# Patient Record
Sex: Male | Born: 1960 | Race: White | Hispanic: No | Marital: Married | Smoking: Never smoker
Health system: Southern US, Community
[De-identification: ages and names within clinical notes are randomized; demographics above are authoritative.]

## PROBLEM LIST (undated history)

## (undated) DIAGNOSIS — K219 Gastro-esophageal reflux disease without esophagitis: Secondary | ICD-10-CM

## (undated) DIAGNOSIS — M2550 Pain in unspecified joint: Secondary | ICD-10-CM

## (undated) DIAGNOSIS — M1990 Unspecified osteoarthritis, unspecified site: Secondary | ICD-10-CM

## (undated) HISTORY — DX: Unspecified osteoarthritis, unspecified site: M19.90

## (undated) HISTORY — PX: NO PAST SURGERIES: SHX2092

## (undated) HISTORY — DX: Pain in unspecified joint: M25.50

---

## 2001-01-05 ENCOUNTER — Emergency Department (HOSPITAL_COMMUNITY): Admission: EM | Admit: 2001-01-05 | Discharge: 2001-01-05 | Payer: Self-pay

## 2012-05-15 ENCOUNTER — Encounter (INDEPENDENT_AMBULATORY_CARE_PROVIDER_SITE_OTHER): Payer: Self-pay | Admitting: *Deleted

## 2013-07-18 ENCOUNTER — Encounter (INDEPENDENT_AMBULATORY_CARE_PROVIDER_SITE_OTHER): Payer: Self-pay | Admitting: *Deleted

## 2013-09-25 ENCOUNTER — Other Ambulatory Visit (INDEPENDENT_AMBULATORY_CARE_PROVIDER_SITE_OTHER): Payer: Self-pay | Admitting: *Deleted

## 2013-09-25 ENCOUNTER — Encounter (INDEPENDENT_AMBULATORY_CARE_PROVIDER_SITE_OTHER): Payer: Self-pay | Admitting: *Deleted

## 2013-09-25 DIAGNOSIS — Z1211 Encounter for screening for malignant neoplasm of colon: Secondary | ICD-10-CM

## 2013-11-11 ENCOUNTER — Telehealth (INDEPENDENT_AMBULATORY_CARE_PROVIDER_SITE_OTHER): Payer: Self-pay | Admitting: *Deleted

## 2013-11-11 DIAGNOSIS — Z1211 Encounter for screening for malignant neoplasm of colon: Secondary | ICD-10-CM

## 2013-11-11 MED ORDER — PEG-KCL-NACL-NASULF-NA ASC-C 100 G PO SOLR: 1.0000 | Freq: Once | ORAL | Status: DC

## 2013-11-11 NOTE — Telephone Encounter (Signed)
Patient needs movi prep 

## 2013-11-21 ENCOUNTER — Telehealth (INDEPENDENT_AMBULATORY_CARE_PROVIDER_SITE_OTHER): Payer: Self-pay | Admitting: *Deleted

## 2013-11-21 NOTE — Telephone Encounter (Signed)
Referring MD/PCP: golding   Procedure: tcs  Reason/Indication:  screening  Has patient had this procedure before?  no  If so, when, by whom and where?    Is there a family history of colon cancer?  no  Who?  What age when diagnosed?    Is patient diabetic?   no      Does patient have prosthetic heart valve?  no  Do you have a pacemaker?  no  Has patient ever had endocarditis? no  Has patient had joint replacement within last 12 months?  no  Does patient tend to be constipated or take laxatives? no  Is patient on Coumadin, Plavix and/or Aspirin? no  Medications: none  Allergies:   Medication Adjustment:   Procedure date & time: 12/18/13 at 830

## 2013-11-24 NOTE — Telephone Encounter (Signed)
agree

## 2013-12-18 ENCOUNTER — Encounter (HOSPITAL_COMMUNITY): Payer: Self-pay | Admitting: *Deleted

## 2013-12-18 ENCOUNTER — Encounter (HOSPITAL_COMMUNITY)
Admission: RE | Disposition: A | Discharge status: Home / Self Care | Payer: Self-pay | Source: Ambulatory Visit | Attending: Internal Medicine

## 2013-12-18 ENCOUNTER — Ambulatory Visit (HOSPITAL_COMMUNITY)
Admission: RE | Admit: 2013-12-18 | Discharge: 2013-12-18 | Disposition: A | Discharge status: Home / Self Care | Payer: Managed Care, Other (non HMO) | Source: Ambulatory Visit | Attending: Internal Medicine | Admitting: Internal Medicine

## 2013-12-18 DIAGNOSIS — Z1211 Encounter for screening for malignant neoplasm of colon: Secondary | ICD-10-CM | POA: Diagnosis present

## 2013-12-18 DIAGNOSIS — K649 Unspecified hemorrhoids: Secondary | ICD-10-CM

## 2013-12-18 DIAGNOSIS — K644 Residual hemorrhoidal skin tags: Secondary | ICD-10-CM | POA: Diagnosis not present

## 2013-12-18 DIAGNOSIS — K219 Gastro-esophageal reflux disease without esophagitis: Secondary | ICD-10-CM | POA: Diagnosis not present

## 2013-12-18 DIAGNOSIS — K5730 Diverticulosis of large intestine without perforation or abscess without bleeding: Secondary | ICD-10-CM | POA: Insufficient documentation

## 2013-12-18 HISTORY — DX: Gastro-esophageal reflux disease without esophagitis: K21.9

## 2013-12-18 HISTORY — PX: COLONOSCOPY: SHX5424

## 2013-12-18 SURGERY — COLONOSCOPY
Anesthesia: Moderate Sedation

## 2013-12-18 MED ORDER — MIDAZOLAM HCL 5 MG/5ML IJ SOLN
INTRAMUSCULAR | Status: AC
  Filled 2013-12-18: qty 10

## 2013-12-18 MED ORDER — MEPERIDINE HCL 50 MG/ML IJ SOLN
INTRAMUSCULAR | Status: DC | PRN
  Administered 2013-12-18 (×2): 25 mg via INTRAVENOUS

## 2013-12-18 MED ORDER — SIMETHICONE 40 MG/0.6ML PO SUSP
ORAL | Status: DC | PRN
  Administered 2013-12-18: 08:00:00

## 2013-12-18 MED ORDER — SODIUM CHLORIDE 0.9 % IV SOLN
INTRAVENOUS | Status: DC
  Administered 2013-12-18: 08:00:00 via INTRAVENOUS

## 2013-12-18 MED ORDER — MEPERIDINE HCL 50 MG/ML IJ SOLN
INTRAMUSCULAR | Status: AC
  Filled 2013-12-18: qty 1

## 2013-12-18 MED ORDER — MIDAZOLAM HCL 5 MG/5ML IJ SOLN
INTRAMUSCULAR | Status: DC | PRN
  Administered 2013-12-18 (×3): 2 mg via INTRAVENOUS

## 2013-12-18 NOTE — H&P (Signed)
Terry Flores is an 53 y.o. male.   Chief Complaint: Patient's here for colonoscopy. HPI: Patient is 53 year old Caucasian male who is here for screening colonoscopy. He denies abdominal pain change in bowel habits or rectal bleeding. Family history is negative for CRC.  Past Medical History  Diagnosis Date  . GERD (gastroesophageal reflux disease)     Past Surgical History  Procedure Laterality Date  . No past surgeries      History reviewed. No pertinent family history. Social History:  reports that he has never smoked. He does not have any smokeless tobacco history on file. He reports that he drinks alcohol. He reports that he does not use illicit drugs.  Allergies: No Known Allergies  Medications Prior to Admission  Medication Sig Dispense Refill  . Flaxseed, Linseed, (FLAXSEED OIL PO) Take 1 capsule by mouth daily.    Nyoka Cowden Tea, Camillia sinensis, (GREEN TEA EXTRACT PO) Take 1 capsule by mouth daily.    Marland Kitchen KRILL OIL PO Take 1 capsule by mouth daily.    Marland Kitchen MILK THISTLE PO Take 1 capsule by mouth daily.    . Multiple Vitamins-Minerals (MULTIVITAMINS THER. W/MINERALS) TABS tablet Take 1 tablet by mouth daily.    . nabumetone (RELAFEN) 500 MG tablet Take 500 mg by mouth daily.    . Omega-3 Fatty Acids (FISH OIL PO) Take 1 capsule by mouth daily.    Marland Kitchen omeprazole (PRILOSEC) 20 MG capsule Take 20 mg by mouth daily.    . peg 3350 powder (MOVIPREP) 100 G SOLR Take 1 kit (200 g total) by mouth once. 1 kit 0  . Pyridoxine HCl (B-6 PO) Take 1 tablet by mouth daily.    . TURMERIC PO Take 1 tablet by mouth daily.      No results found for this or any previous visit (from the past 48 hour(s)). No results found.  ROS  Blood pressure 148/80, pulse 57, temperature 97.7 F (36.5 C), temperature source Oral, resp. rate 17, height $RemoveBe'6\' 1"'QjZMxUlUs$  (1.854 m), weight 196 lb (88.905 kg), SpO2 97 %. Physical Exam  Constitutional: He appears well-developed and well-nourished.  HENT:  Mouth/Throat:  Oropharynx is clear and moist.  Eyes: Conjunctivae are normal. No scleral icterus.  Neck: No thyromegaly present.  Cardiovascular: Normal rate, regular rhythm and normal heart sounds.   No murmur heard. Respiratory: Effort normal and breath sounds normal.  GI: Soft. He exhibits no distension and no mass. There is no tenderness.  Musculoskeletal: He exhibits no edema.  Lymphadenopathy:    He has no cervical adenopathy.  Neurological: He is alert.  Skin: Skin is warm and dry.     Assessment/Plan Average risk screening colonoscopy.  Avarose Mervine U 12/18/2013, 8:19 AM

## 2013-12-18 NOTE — Discharge Instructions (Signed)
Resume usual medications and high fiber diet. °No driving for 24 hours. °Next screening exam in 10 years. ° ° °Colonoscopy, Care After °Refer to this sheet in the next few weeks. These instructions provide you with information on caring for yourself after your procedure. Your health care provider may also give you more specific instructions. Your treatment has been planned according to current medical practices, but problems sometimes occur. Call your health care provider if you have any problems or questions after your procedure. °WHAT TO EXPECT AFTER THE PROCEDURE  °After your procedure, it is typical to have the following: °· A small amount of blood in your stool. °· Moderate amounts of gas and mild abdominal cramping or bloating. °HOME CARE INSTRUCTIONS °· Do not drive, operate machinery, or sign important documents for 24 hours. °· You may shower and resume your regular physical activities, but move at a slower pace for the first 24 hours. °· Take frequent rest periods for the first 24 hours. °· Walk around or put a warm pack on your abdomen to help reduce abdominal cramping and bloating. °· Drink enough fluids to keep your urine clear or pale yellow. °· You may resume your normal diet as instructed by your health care provider. Avoid heavy or fried foods that are hard to digest. °· Avoid drinking alcohol for 24 hours or as instructed by your health care provider. °· Only take over-the-counter or prescription medicines as directed by your health care provider. °· If a tissue sample (biopsy) was taken during your procedure: °¨ Do not take aspirin or blood thinners for 7 days, or as instructed by your health care provider. °¨ Do not drink alcohol for 7 days, or as instructed by your health care provider. °¨ Eat soft foods for the first 24 hours. °SEEK MEDICAL CARE IF: °You have persistent spotting of blood in your stool 2-3 days after the procedure. °SEEK IMMEDIATE MEDICAL CARE IF: °· You have more than a small  spotting of blood in your stool. °· You pass large blood clots in your stool. °· Your abdomen is swollen (distended). °· You have nausea or vomiting. °· You have a fever. °· You have increasing abdominal pain that is not relieved with medicine. °Document Released: 08/10/2003 Document Revised: 10/16/2012 Document Reviewed: 09/02/2012 °ExitCare® Patient Information ©2015 [employer]. This information is not intended to replace advice given to you by your health care provider. Make sure you discuss any questions you have with your health care provider. ° °High-Fiber Diet °Fiber is found in fruits, vegetables, and grains. A high-fiber diet encourages the addition of more whole grains, legumes, fruits, and vegetables in your diet. The recommended amount of fiber for adult males is 38 g per day. For adult females, it is 25 g per day. Pregnant and lactating women should get 28 g of fiber per day. If you have a digestive or bowel problem, ask your caregiver for advice before adding high-fiber foods to your diet. Eat a variety of high-fiber foods instead of only a select few type of foods.  °PURPOSE °· To increase stool bulk. °· To make bowel movements more regular to prevent constipation. °· To lower cholesterol. °· To prevent overeating. °WHEN IS THIS DIET USED? °· It may be used if you have constipation and hemorrhoids. °· It may be used if you have uncomplicated diverticulosis (intestine condition) and irritable bowel syndrome. °· It may be used if you need help with weight management. °· It may be used if you want   to add it to your diet as a protective measure against atherosclerosis, diabetes, and cancer. °SOURCES OF FIBER °· Whole-grain breads and cereals. °· Fruits, such as apples, oranges, bananas, berries, prunes, and pears. °· Vegetables, such as green peas, carrots, sweet potatoes, beets, broccoli, cabbage, spinach, and artichokes. °· Legumes, such split peas, soy, lentils. °· Almonds. °FIBER CONTENT IN  FOODS °Starches and Grains / Dietary Fiber (g) °· Cheerios, 1 cup / 3 g °· Corn Flakes cereal, 1 cup / 0.7 g °· Rice crispy treat cereal, 1¼ cup / 0.3 g °· Instant oatmeal (cooked), ½ cup / 2 g °· Frosted wheat cereal, 1 cup / 5.1 g °· Brown, long-grain rice (cooked), 1 cup / 3.5 g °· White, long-grain rice (cooked), 1 cup / 0.6 g °· Enriched macaroni (cooked), 1 cup / 2.5 g °Legumes / Dietary Fiber (g) °· Baked beans (canned, plain, or vegetarian), ½ cup / 5.2 g °· Kidney beans (canned), ½ cup / 6.8 g °· Pinto beans (cooked), ½ cup / 5.5 g °Breads and Crackers / Dietary Fiber (g) °· Plain or honey graham crackers, 2 squares / 0.7 g °· Saltine crackers, 3 squares / 0.3 g °· Plain, salted pretzels, 10 pieces / 1.8 g °· Whole-wheat bread, 1 slice / 1.9 g °· White bread, 1 slice / 0.7 g °· Raisin bread, 1 slice / 1.2 g °· Plain bagel, 3 oz / 2 g °· Flour tortilla, 1 oz / 0.9 g °· Corn tortilla, 1 small / 1.5 g °· Hamburger or hotdog bun, 1 small / 0.9 g °Fruits / Dietary Fiber (g) °· Apple with skin, 1 medium / 4.4 g °· Sweetened applesauce, ½ cup / 1.5 g °· Banana, ½ medium / 1.5 g °· Grapes, 10 grapes / 0.4 g °· Orange, 1 small / 2.3 g °· Raisin, 1.5 oz / 1.6 g °· Melon, 1 cup / 1.4 g °Vegetables / Dietary Fiber (g) °· Green beans (canned), ½ cup / 1.3 g °· Carrots (cooked), ½ cup / 2.3 g °· Broccoli (cooked), ½ cup / 2.8 g °· Peas (cooked), ½ cup / 4.4 g °· Mashed potatoes, ½ cup / 1.6 g °· Lettuce, 1 cup / 0.5 g °· Corn (canned), ½ cup / 1.6 g °· Tomato, ½ cup / 1.1 g °Document Released: 12/26/2004 Document Revised: 06/27/2011 Document Reviewed: 03/30/2011 °ExitCare® Patient Information ©2015 [employer]. This information is not intended to replace advice given to you by your health care provider. Make sure you discuss any questions you have with your health care provider. ° °

## 2013-12-18 NOTE — Op Note (Signed)
COLONOSCOPY PROCEDURE REPORT  PATIENT:  Terry AbedCharles T Duhamel  MR#:  [id number] Birthdate:  12/19/1960, 53 y.o., male Endoscopist:  Dr. Malissa HippoNajeeb U. Lilianne Delair, MD Referred By:  Dr. Colette RibasJohn Cabot Golding, MD Procedure Date: 12/18/2013  Procedure:   Colonoscopy  Indications:  Patient is 53 year old Caucasian male who is undergoing average risk screening colonoscopy.  Informed Consent:  The procedure and risks were reviewed with the patient and informed consent was obtained.  Medications:  Demerol 50 mg IV Versed 6 mg IV  Description of procedure:  After a digital rectal exam was performed, that colonoscope was advanced from the anus through the rectum and colon to the area of the cecum, ileocecal valve and appendiceal orifice. The cecum was deeply intubated. These structures were well-seen and photographed for the record. From the level of the cecum and ileocecal valve, the scope was slowly and cautiously withdrawn. The mucosal surfaces were carefully surveyed utilizing scope tip to flexion to facilitate fold flattening as needed. The scope was pulled down into the rectum where a thorough exam including retroflexion was performed.  Findings:   Prep excellent. Single diverticulum in ascending colon. Moderate number of small diverticula at sigmoid colon. Normal rectal mucosa. Small hemorrhoids below the dentate line   Therapeutic/Diagnostic Maneuvers Performed:   None  Complications:  None  Cecal Withdrawal Time:  9 minutes  Impression:  Examination performed to cecum. Moderate sigmoid diverticulosis. Single diverticulum in ascending colon. Small external hemorrhoids.  Recommendations:  Standard instructions given. High fiber diet. Next screening exam in 10 years.  Raynald Rouillard U  12/18/2013 8:59 AM  CC: Dr. Phillips OdorGOLDING, Chancy HurterJOHN CABOT, MD & Dr. Bonnetta BarryNo ref. provider found

## 2013-12-19 ENCOUNTER — Encounter (HOSPITAL_COMMUNITY): Payer: Self-pay | Admitting: Internal Medicine

## 2017-06-25 ENCOUNTER — Institutional Professional Consult (permissible substitution): Payer: Managed Care, Other (non HMO) | Admitting: Sports Medicine

## 2017-07-18 ENCOUNTER — Institutional Professional Consult (permissible substitution): Payer: Managed Care, Other (non HMO) | Admitting: Sports Medicine

## 2017-08-02 ENCOUNTER — Encounter: Payer: Self-pay | Admitting: Sports Medicine

## 2017-08-02 ENCOUNTER — Ambulatory Visit (INDEPENDENT_AMBULATORY_CARE_PROVIDER_SITE_OTHER): Payer: No Typology Code available for payment source

## 2017-08-02 ENCOUNTER — Ambulatory Visit (INDEPENDENT_AMBULATORY_CARE_PROVIDER_SITE_OTHER): Payer: No Typology Code available for payment source | Admitting: Sports Medicine

## 2017-08-02 DIAGNOSIS — M170 Bilateral primary osteoarthritis of knee: Secondary | ICD-10-CM

## 2017-08-02 DIAGNOSIS — L4050 Arthropathic psoriasis, unspecified: Secondary | ICD-10-CM | POA: Diagnosis not present

## 2017-08-02 MED ORDER — ACETAMINOPHEN ER 650 MG PO TBCR: 650.0000 mg | EXTENDED_RELEASE_TABLET | Freq: Three times a day (TID) | ORAL | 3 refills | Status: AC | PRN

## 2017-08-02 MED ORDER — DICLOFENAC SODIUM 2 % TD SOLN: 2.0000 | Freq: Two times a day (BID) | TRANSDERMAL | 11 refills | Status: AC

## 2017-08-02 NOTE — Assessment & Plan Note (Signed)
With what I think is some primary osteoarthritis as well as degenerative meniscal tear in the right knee. No overt mechanical symptoms, we will start conservatively with Tylenol, topical Pennsaid (diclofenac 2% topical), rehab exercises, x-rays. Return to see me in 1 month, injection if no better. He was on nabumetone in the distant past which worked well but stopped it due to concern for cardiovascular risk.

## 2017-08-02 NOTE — Progress Notes (Signed)
Subjective:    I'm seeing this patient as a consultation for: Dr. Assunta FoundJohn Golding.  Ailene RavelJenny Addison, NP  CC: Right knee pain  HPI: This is a pleasant 57 year old male, for years she is had right knee pain, posterior joint line, worse with twisting, as and when playing golf, and when going up and down stairs.  Worse with deep flexion, significant gelling.  He has a history of psoriasis, and was on nabumetone for psoriatic arthritis in the distant past that did provide good relief, he was recently instructed to discontinue NSAIDs due to cardiovascular risk.  Has not tried Tylenol, therapy.  No overt mechanical symptoms such as locking, catching, buckling.  I reviewed the past medical history, family history, social history, surgical history, and allergies today and no changes were needed.  Please see the problem list section below in epic for further details.  Past Medical History: Past Medical History:  Diagnosis Date  . Arthritis   . GERD (gastroesophageal reflux disease)   . Joint pain    Past Surgical History: Past Surgical History:  Procedure Laterality Date  . COLONOSCOPY N/A 12/18/2013   Procedure: COLONOSCOPY;  Surgeon: Malissa HippoNajeeb U Rehman, MD;  Location: [facility];  Service: Endoscopy;  Laterality: N/A;  830  . NO PAST SURGERIES     Social History: Social History   Socioeconomic History  . Marital status: Married    Spouse name: Not on file  . Number of children: Not on file  . Years of education: Not on file  . Highest education level: Not on file  Occupational History  . Not on file  Social Needs  . Financial resource strain: Not on file  . Food insecurity:    Worry: Not on file    Inability: Not on file  . Transportation needs:    Medical: Not on file    Non-medical: Not on file  Tobacco Use  . Smoking status: Never Smoker  . Smokeless tobacco: Never Used  Substance and Sexual Activity  . Alcohol use: Yes    Comment: occasional beer  . Drug use: Never  .  Sexual activity: Not on file  Lifestyle  . Physical activity:    Days per week: Not on file    Minutes per session: Not on file  . Stress: Not on file  Relationships  . Social connections:    Talks on phone: Not on file    Gets together: Not on file    Attends religious service: Not on file    Active member of club or organization: Not on file    Attends meetings of clubs or organizations: Not on file    Relationship status: Not on file  Other Topics Concern  . Not on file  Social History Narrative  . Not on file   Family History: History reviewed. No pertinent family history. Allergies: No Known Allergies Medications: See med rec.  Review of Systems: No headache, visual changes, nausea, vomiting, diarrhea, constipation, dizziness, abdominal pain, skin rash, fevers, chills, night sweats, weight loss, swollen lymph nodes, body aches, joint swelling, muscle aches, chest pain, shortness of breath, mood changes, visual or auditory hallucinations.   Objective:   General: Well Developed, well nourished, and in no acute distress.  Neuro:  Extra-ocular muscles intact, able to move all 4 extremities, sensation grossly intact.  Deep tendon reflexes tested were normal. Psych: Alert and oriented, mood congruent with affect. ENT:  Ears and nose appear unremarkable.  Hearing grossly normal. Neck: Unremarkable  overall appearance, trachea midline.  No visible thyroid enlargement. Eyes: Conjunctivae and lids appear unremarkable.  Pupils equal and round. Skin: Warm and dry, no rashes noted.  Cardiovascular: Pulses palpable, no extremity edema. Right knee: Normal to inspection with no erythema or effusion or obvious bony abnormalities. No discrete areas of tenderness to palpation, he does have pain with terminal flexion. ROM normal in flexion and extension and lower leg rotation. Ligaments with solid consistent endpoints including ACL, PCL, LCL, MCL. Positive McMurray's with pain but not a  palpable catch. Non painful patellar compression. Patellar and quadriceps tendons unremarkable. Hamstring and quadriceps strength is normal.  Impression and Recommendations:   This case required medical decision making of moderate complexity.  Psoriatic arthritis (HCC) With what I think is some primary osteoarthritis as well as degenerative meniscal tear in the right knee. No overt mechanical symptoms, we will start conservatively with Tylenol, topical Pennsaid (diclofenac 2% topical), rehab exercises, x-rays. Return to see me in 1 month, injection if no better. He was on nabumetone in the distant past which worked well but stopped it due to concern for cardiovascular risk. ___________________________________________ Ihor Austin. Benjamin Stain, M.D., ABFM., CAQSM. Primary Care and Sports Medicine [facility]  Adjunct Instructor of Family Medicine  [employer]

## 2017-08-30 ENCOUNTER — Ambulatory Visit (INDEPENDENT_AMBULATORY_CARE_PROVIDER_SITE_OTHER): Payer: No Typology Code available for payment source | Admitting: Sports Medicine

## 2017-08-30 ENCOUNTER — Encounter: Payer: Self-pay | Admitting: Sports Medicine

## 2017-08-30 DIAGNOSIS — M1711 Unilateral primary osteoarthritis, right knee: Secondary | ICD-10-CM | POA: Diagnosis not present

## 2017-08-30 NOTE — Progress Notes (Signed)
Subjective:    CC: Right knee pain  HPI: This is a pleasant 57 year old male with a history of psoriasis, he returns for follow-up, we treated him conservatively regarding his right medial joint line pain, did not really respond to Tylenol or Pennsaid (diclofenac 2% topical), was not really diligent with the Pennsaid (diclofenac 2% topical) and he desired to avoid systemic NSAIDs.  Pain is moderate, persistent, localized at the medial joint line without radiation.  I reviewed the past medical history, family history, social history, surgical history, and allergies today and no changes were needed.  Please see the problem list section below in epic for further details.  Past Medical History: Past Medical History:  Diagnosis Date  . Arthritis   . GERD (gastroesophageal reflux disease)   . Joint pain    Past Surgical History: Past Surgical History:  Procedure Laterality Date  . COLONOSCOPY N/A 12/18/2013   Procedure: COLONOSCOPY;  Surgeon: Malissa Hippo, MD;  Location: [facility];  Service: Endoscopy;  Laterality: N/A;  830  . NO PAST SURGERIES     Social History: Social History   Socioeconomic History  . Marital status: Married    Spouse name: Not on file  . Number of children: Not on file  . Years of education: Not on file  . Highest education level: Not on file  Occupational History  . Not on file  Social Needs  . Financial resource strain: Not on file  . Food insecurity:    Worry: Not on file    Inability: Not on file  . Transportation needs:    Medical: Not on file    Non-medical: Not on file  Tobacco Use  . Smoking status: Never Smoker  . Smokeless tobacco: Never Used  Substance and Sexual Activity  . Alcohol use: Yes    Comment: occasional beer  . Drug use: Never  . Sexual activity: Not on file  Lifestyle  . Physical activity:    Days per week: Not on file    Minutes per session: Not on file  . Stress: Not on file  Relationships  . Social  connections:    Talks on phone: Not on file    Gets together: Not on file    Attends religious service: Not on file    Active member of club or organization: Not on file    Attends meetings of clubs or organizations: Not on file    Relationship status: Not on file  Other Topics Concern  . Not on file  Social History Narrative  . Not on file   Family History: No family history on file. Allergies: No Known Allergies Medications: See med rec.  Review of Systems: No fevers, chills, night sweats, weight loss, chest pain, or shortness of breath.   Objective:    General: Well Developed, well nourished, and in no acute distress.  Neuro: Alert and oriented x3, extra-ocular muscles intact, sensation grossly intact.  HEENT: Normocephalic, atraumatic, pupils equal round reactive to light, neck supple, no masses, no lymphadenopathy, thyroid nonpalpable.  Skin: Warm and dry, no rashes. Cardiac: Regular rate and rhythm, no murmurs rubs or gallops, no lower extremity edema.  Respiratory: Clear to auscultation bilaterally. Not using accessory muscles, speaking in full sentences. Right knee: Normal to inspection with no erythema or effusion or obvious bony abnormalities. Pain at the medial joint line ROM normal in flexion and extension and lower leg rotation. Ligaments with solid consistent endpoints including ACL, PCL, LCL, MCL. Negative Mcmurray's and  provocative meniscal tests. Non painful patellar compression. Patellar and quadriceps tendons unremarkable. Hamstring and quadriceps strength is normal.  Procedure: Real-time Ultrasound Guided Injection of right knee Device: GE Logiq E  Verbal informed consent obtained.  Time-out conducted.  Noted no overlying erythema, induration, or other signs of local infection.  Skin prepped in a sterile fashion.  Local anesthesia: Topical Ethyl chloride.  With sterile technique and under real time ultrasound guidance: 1 cc kenalog 40, 2 cc lidocaine,  2 cc bupivacaine injected into the suprapatellar recess with a 25-gauge needle. Completed without difficulty  Pain immediately resolved suggesting accurate placement of the medication.  Advised to call if fevers/chills, erythema, induration, drainage, or persistent bleeding.  Images permanently stored and available for review in the ultrasound unit.  Impression: Technically successful ultrasound guided injection.  Impression and Recommendations:    Primary osteoarthritis of right knee There probably is more component of degenerative osteoarthritis with a generative meniscal tear, likely without much mechanical symptoms. There is also a contribution from psoriasis. Topical Pennsaid (diclofenac 2% topical) did not provide much relief, he really did not take much of the Tylenol. Proceeding to injection, if insufficient relief we will proceed with Viscosupplementation.   If this is not effective we will consider platelet rich plasma injection. I do think Prolozone which is offered at [facility] would also be a good option, we did discuss the benefits, risks, and limitations in known efficacy of all the above treatments. Return to see me in 1 month. ___________________________________________ Ihor Austinhomas J. Benjamin Stainhekkekandam, M.D., ABFM., CAQSM. Primary Care and Sports Medicine [facility]  Adjunct Instructor of Family Medicine  [employer]

## 2017-08-30 NOTE — Assessment & Plan Note (Addendum)
There probably is more component of degenerative osteoarthritis with a generative meniscal tear, likely without much mechanical symptoms. There is also a contribution from psoriasis. Topical Pennsaid (diclofenac 2% topical) did not provide much relief, he really did not take much of the Tylenol. Proceeding to injection, if insufficient relief we will proceed with Viscosupplementation.   If this is not effective we will consider platelet rich plasma injection. I do think Prolozone which is offered at [facility] would also be a good option, we did discuss the benefits, risks, and limitations in known efficacy of all the above treatments. Return to see me in 1 month.

## 2017-10-16 ENCOUNTER — Encounter: Payer: Self-pay | Admitting: Sports Medicine

## 2017-10-16 ENCOUNTER — Ambulatory Visit (INDEPENDENT_AMBULATORY_CARE_PROVIDER_SITE_OTHER): Payer: No Typology Code available for payment source | Admitting: Sports Medicine

## 2017-10-16 DIAGNOSIS — M1711 Unilateral primary osteoarthritis, right knee: Secondary | ICD-10-CM | POA: Diagnosis not present

## 2017-10-16 NOTE — Assessment & Plan Note (Signed)
Dorsal response to steroid injection. We are going to get him approved for Visco supplementation. I do think he needs an MRI as I do suspect there is a large meniscal tear. If Visco supplementation does not improve his pain we will proceed with Coolief radiofrequency ablation. Pain is not bad enough at this point to consider total knee arthroplasty.

## 2017-10-16 NOTE — Progress Notes (Signed)
Subjective:    CC: Right knee pain  HPI: Terry Flores returns, he has right knee osteoarthritis, we did a steroid injection at the last visit, he had fantastic relief but only shortly.  Moderate pain in the posterior joint line, worse with deep flexion, minimal swelling, no mechanical symptoms, no trauma.  Oral NSAIDs are not effective, he has been doing an anti-inflammatory diet that has seemed to help to some degree.  I reviewed the past medical history, family history, social history, surgical history, and allergies today and no changes were needed.  Please see the problem list section below in epic for further details.  Past Medical History: Past Medical History:  Diagnosis Date  . Arthritis   . GERD (gastroesophageal reflux disease)   . Joint pain    Past Surgical History: Past Surgical History:  Procedure Laterality Date  . COLONOSCOPY N/A 12/18/2013   Procedure: COLONOSCOPY;  Surgeon: Malissa Hippo, MD;  Location: [facility];  Service: Endoscopy;  Laterality: N/A;  830  . NO PAST SURGERIES     Social History: Social History   Socioeconomic History  . Marital status: Married    Spouse name: Not on file  . Number of children: Not on file  . Years of education: Not on file  . Highest education level: Not on file  Occupational History  . Not on file  Social Needs  . Financial resource strain: Not on file  . Food insecurity:    Worry: Not on file    Inability: Not on file  . Transportation needs:    Medical: Not on file    Non-medical: Not on file  Tobacco Use  . Smoking status: Never Smoker  . Smokeless tobacco: Never Used  Substance and Sexual Activity  . Alcohol use: Yes    Comment: occasional beer  . Drug use: Never  . Sexual activity: Not on file  Lifestyle  . Physical activity:    Days per week: Not on file    Minutes per session: Not on file  . Stress: Not on file  Relationships  . Social connections:    Talks on phone: Not on file    Gets  together: Not on file    Attends religious service: Not on file    Active member of club or organization: Not on file    Attends meetings of clubs or organizations: Not on file    Relationship status: Not on file  Other Topics Concern  . Not on file  Social History Narrative  . Not on file   Family History: No family history on file. Allergies: No Known Allergies Medications: See med rec.  Review of Systems: No fevers, chills, night sweats, weight loss, chest pain, or shortness of breath.   Objective:    General: Well Developed, well nourished, and in no acute distress.  Neuro: Alert and oriented x3, extra-ocular muscles intact, sensation grossly intact.  HEENT: Normocephalic, atraumatic, pupils equal round reactive to light, neck supple, no masses, no lymphadenopathy, thyroid nonpalpable.  Skin: Warm and dry, no rashes. Cardiac: Regular rate and rhythm, no murmurs rubs or gallops, no lower extremity edema.  Respiratory: Clear to auscultation bilaterally. Not using accessory muscles, speaking in full sentences. Right knee: Minimal effusion, possibly mild tenderness at the posterior medial joint line ROM normal in flexion and extension and lower leg rotation. Ligaments with solid consistent endpoints including ACL, PCL, LCL, MCL. Negative Mcmurray's and provocative meniscal tests. Non painful patellar compression. Patellar and quadriceps tendons  unremarkable. Hamstring and quadriceps strength is normal.  Impression and Recommendations:    Primary osteoarthritis of right knee Dorsal response to steroid injection. We are going to get him approved for Visco supplementation. I do think he needs an MRI as I do suspect there is a large meniscal tear. If Visco supplementation does not improve his pain we will proceed with Coolief radiofrequency ablation. Pain is not bad enough at this point to consider total knee arthroplasty.  I spent 25 minutes with this patient, greater than  50% was face-to-face time counseling regarding the above diagnoses, specifically discussed treatment protocol and options available including nonsurgical options for knee osteoarthritis. ___________________________________________ Ihor Austin. Benjamin Stain, M.D., ABFM., CAQSM. Primary Care and Sports Medicine  [facility]  Adjunct Instructor of Family Medicine  [employer]

## 2017-10-17 ENCOUNTER — Telehealth: Payer: Self-pay | Admitting: Sports Medicine

## 2017-10-17 NOTE — Telephone Encounter (Signed)
-----   Message from Neldon Labella, CMA sent at 10/17/2017  2:06 PM EDT ----- Information has been submitted to Orthovisc and awaiting determination.  ----- Message ----- From: Monica Becton, MD Sent: 10/16/2017   1:45 PM EDT To: Willeen Niece Mabe, CMA  Right knee Orthovisc approval, failed steroid injections, NSAIDs, x-ray confirmed. ___________________________________________ Ihor Austin. Benjamin Stain, M.D., ABFM., CAQSM. Primary Care and Sports Medicine [place] [facility]  Adjunct Instructor of Family Medicine  [employer]

## 2017-10-19 NOTE — Telephone Encounter (Signed)
Orthovisc is covered. If collected, the office visit copay is $80.00. Specialist Office Visit Copay applies to each successive visit if collected. After the deductible has been met, the patient's responsibility will be 20% of the allowable amount for the product and administration. Once the out of pocket is met, the patient will have no financial responsibility. Call reference is Diane J. 10/18/17.  Patient has been scheduled for 1st knee injections, ok per Dr. Darene Lamer. Authorization number: [id number].

## 2017-11-05 ENCOUNTER — Ambulatory Visit (INDEPENDENT_AMBULATORY_CARE_PROVIDER_SITE_OTHER): Payer: No Typology Code available for payment source

## 2017-11-05 ENCOUNTER — Ambulatory Visit (INDEPENDENT_AMBULATORY_CARE_PROVIDER_SITE_OTHER): Payer: No Typology Code available for payment source | Admitting: Sports Medicine

## 2017-11-05 DIAGNOSIS — M1711 Unilateral primary osteoarthritis, right knee: Secondary | ICD-10-CM

## 2017-11-05 NOTE — Assessment & Plan Note (Signed)
Temporary response to steroid injection. Starting Orthovisc today, injection #1 of 4 into the right knee today, return in 1 week for #2 of 4. We can certainly proceed with Coolief radio frequency ablation if Orthovisc does not provide good relief. He is not interested in arthroplasty.

## 2017-11-05 NOTE — Progress Notes (Signed)

## 2017-11-13 ENCOUNTER — Ambulatory Visit (INDEPENDENT_AMBULATORY_CARE_PROVIDER_SITE_OTHER): Payer: No Typology Code available for payment source | Admitting: Sports Medicine

## 2017-11-13 DIAGNOSIS — M1711 Unilateral primary osteoarthritis, right knee: Secondary | ICD-10-CM | POA: Diagnosis not present

## 2017-11-13 NOTE — Progress Notes (Signed)
   Procedure: Real-time Ultrasound Guided Injection of right knee Device: GE Logiq E  Verbal informed consent obtained.  Time-out conducted.  Noted no overlying erythema, induration, or other signs of local infection.  Skin prepped in a sterile fashion.  Local anesthesia: Topical Ethyl chloride.  With sterile technique and under real time ultrasound guidance: 30 mg/2 mL of OrthoVisc (sodium hyaluronate) in a prefilled syringe was injected easily into the knee through a 22-gauge needle.[id number] Completed without difficulty  Pain immediately resolved suggesting accurate placement of the medication.  Advised to call if fevers/chills, erythema, induration, drainage, or persistent bleeding.  Images permanently stored and available for review in the ultrasound unit.  Impression: Technically successful ultrasound guided injection. 

## 2017-11-13 NOTE — Assessment & Plan Note (Signed)
Orthovisc No. 2 of 4 into the right knee, return in 1 week for #3 of 4. We will proceed with Coolief radio frequency ablation if Orthovisc does not provide relief. He is not interested in arthroplasty.

## 2017-11-23 ENCOUNTER — Ambulatory Visit (INDEPENDENT_AMBULATORY_CARE_PROVIDER_SITE_OTHER): Payer: No Typology Code available for payment source | Admitting: Sports Medicine

## 2017-11-23 DIAGNOSIS — M1711 Unilateral primary osteoarthritis, right knee: Secondary | ICD-10-CM | POA: Diagnosis not present

## 2017-11-23 NOTE — Assessment & Plan Note (Signed)
Orthovisc No. 3 of 4 into the right knee, return in 1 week for Orthovisc No. 4 of 4 right knee We can proceed with Coolief radio frequency ablation if Orthovisc does not provide relief, he is not interested in arthroplasty.

## 2017-11-23 NOTE — Progress Notes (Signed)
   Procedure: Real-time Ultrasound Guided Injection of right knee Device: GE Logiq E  Verbal informed consent obtained.  Time-out conducted.  Noted no overlying erythema, induration, or other signs of local infection.  Skin prepped in a sterile fashion.  Local anesthesia: Topical Ethyl chloride.  With sterile technique and under real time ultrasound guidance: 30 mg/2 mL of OrthoVisc (sodium hyaluronate) in a prefilled syringe was injected easily into the knee through a 22-gauge needle.[id number] Completed without difficulty  Pain immediately resolved suggesting accurate placement of the medication.  Advised to call if fevers/chills, erythema, induration, drainage, or persistent bleeding.  Images permanently stored and available for review in the ultrasound unit.  Impression: Technically successful ultrasound guided injection. 

## 2017-11-30 ENCOUNTER — Ambulatory Visit (INDEPENDENT_AMBULATORY_CARE_PROVIDER_SITE_OTHER): Payer: No Typology Code available for payment source | Admitting: Sports Medicine

## 2017-11-30 DIAGNOSIS — M1711 Unilateral primary osteoarthritis, right knee: Secondary | ICD-10-CM | POA: Diagnosis not present

## 2017-11-30 NOTE — Progress Notes (Signed)
   Procedure: Real-time Ultrasound Guided Injection of right knee Device: GE Logiq E  Verbal informed consent obtained.  Time-out conducted.  Noted no overlying erythema, induration, or other signs of local infection.  Skin prepped in a sterile fashion.  Local anesthesia: Topical Ethyl chloride.  With sterile technique and under real time ultrasound guidance: 30 mg/2 mL of OrthoVisc (sodium hyaluronate) in a prefilled syringe was injected easily into the knee through a 22-gauge needle.[id number] Completed without difficulty  Pain immediately resolved suggesting accurate placement of the medication.  Advised to call if fevers/chills, erythema, induration, drainage, or persistent bleeding.  Images permanently stored and available for review in the ultrasound unit.  Impression: Technically successful ultrasound guided injection.

## 2017-11-30 NOTE — Assessment & Plan Note (Signed)
Orthovisc No. 4 of 4 into the right knee, return in 1 month. Coolief radio frequency ablation would be considered afterwards, he is not interested in arthroplasty

## 2018-01-24 ENCOUNTER — Ambulatory Visit: Payer: No Typology Code available for payment source | Admitting: Sports Medicine

## 2018-01-25 ENCOUNTER — Ambulatory Visit (INDEPENDENT_AMBULATORY_CARE_PROVIDER_SITE_OTHER): Payer: Managed Care, Other (non HMO) | Admitting: Sports Medicine

## 2018-01-25 DIAGNOSIS — M1711 Unilateral primary osteoarthritis, right knee: Secondary | ICD-10-CM

## 2018-01-25 NOTE — Assessment & Plan Note (Signed)
Osteoarthritis with extensive cartilage loss as well as complex tearing of the posterior horn of the medial meniscus on MRI. We have finished a steroid injection as well as a full series of Orthovisc without any improvement. He is entirely not interested in knee arthroplasty. At this point I would like opinions from orthopedic surgery for consideration of arthroscopy for his complex meniscal tear, we did discuss that arthroscopy for meniscal tear in the setting of osteoarthritis was not tremendously efficacious. I would also like him to discuss his symptoms with 1 of our anesthesiologists with regards to genicular radiofrequency ablation.

## 2018-01-25 NOTE — Progress Notes (Signed)
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  Ultimately we got an MRI that showed complex tearing of the posterior horn of the medial meniscus as well as extensive cartilage loss in both tibiofemoral compartments.  We tried a steroid injection with minimal relief, we did a full series of Visco supplementation.  He has been consistently resistant to any discussion of knee arthroplasty.  Unfortunately he returns with persistent discomfort.  I reviewed the past medical history, family history, social history, surgical history, and allergies today and no changes were needed.  Please see the problem list section below in epic for further details.  Past Medical History: Past Medical History:  Diagnosis Date  . Arthritis   . GERD (gastroesophageal reflux disease)   . Joint pain    Past Surgical History: Past Surgical History:  Procedure Laterality Date  . COLONOSCOPY N/A 12/18/2013   Procedure: COLONOSCOPY;  Surgeon: Malissa Hippo, MD;  Location: [facility];  Service: Endoscopy;  Laterality: N/A;  830  . NO PAST SURGERIES     Social History: Social History   Socioeconomic History  . Marital status: Married    Spouse name: Not on file  . Number of children: Not on file  . Years of education: Not on file  . Highest education level: Not on file  Occupational History  . Not on file  Social Needs  . Financial resource strain: Not on file  . Food insecurity:    Worry: Not on file    Inability: Not on file  . Transportation needs:    Medical: Not on file    Non-medical: Not on file  Tobacco Use  . Smoking status: Never Smoker  . Smokeless tobacco: Never Used  Substance and Sexual Activity  . Alcohol use: Yes    Comment: occasional beer  . Drug use: Never  . Sexual activity: Not on file  Lifestyle  . Physical activity:    Days per week: Not on file    Minutes per session: Not on file  .  Stress: Not on file  Relationships  . Social connections:    Talks on phone: Not on file    Gets together: Not on file    Attends religious service: Not on file    Active member of club or organization: Not on file    Attends meetings of clubs or organizations: Not on file    Relationship status: Not on file  Other Topics Concern  . Not on file  Social History Narrative  . Not on file   Family History: No family history on file. Allergies: No Known Allergies Medications: See med rec.  Review of Systems: No fevers, chills, night sweats, weight loss, chest pain, or shortness of breath.   Objective:    General: Well Developed, well nourished, and in no acute distress.  Neuro: Alert and oriented x3, extra-ocular muscles intact, sensation grossly intact.  HEENT: Normocephalic, atraumatic, pupils equal round reactive to light, neck supple, no masses, no lymphadenopathy, thyroid nonpalpable.  Skin: Warm and dry, no rashes. Cardiac: Regular rate and rhythm, no murmurs rubs or gallops, no lower extremity edema.  Respiratory: Clear to auscultation bilaterally. Not using accessory muscles, speaking in full sentences.  Impression and Recommendations:    Primary osteoarthritis of right knee Osteoarthritis with extensive cartilage loss as well as complex tearing of the posterior horn of the medial meniscus  on MRI. We have finished a steroid injection as well as a full series of Orthovisc without any improvement. He is entirely not interested in knee arthroplasty. At this point I would like opinions from orthopedic surgery for consideration of arthroscopy for his complex meniscal tear, we did discuss that arthroscopy for meniscal tear in the setting of osteoarthritis was not tremendously efficacious. I would also like him to discuss his symptoms with 1 of our anesthesiologists with regards to genicular radiofrequency ablation. ___________________________________________ Ihor Austin.  Benjamin Stain, M.D., ABFM., CAQSM. Primary Care and Sports Medicine [place] [facility]  Adjunct Professor of Family Medicine  [employer]

## 2019-06-27 IMAGING — MR MR KNEE*R* W/O CM
7 series · 40 of 40 positions shown · non-contrast
Comparison: None.

CLINICAL DATA: Generalized right knee pain.

EXAM:
MRI OF THE RIGHT KNEE WITHOUT CONTRAST
TECHNIQUE: Multiplanar, multisequence MR imaging of the knee was performed. No
intravenous contrast was administered.

[Series 3: T2 fat-sat · axial · 4.0mm · 0.53mm/px · z∈[-112,+33]mm · 6 of 30 slices shown (1 of 3)]
[im 1/30]
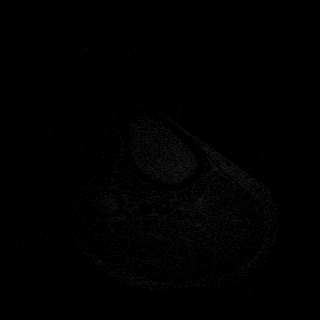
[im 6/30]
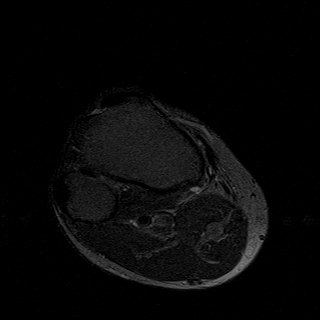
[im 12/30]
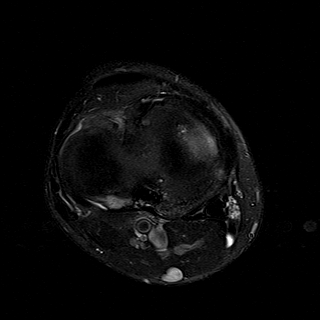
[im 18/30]
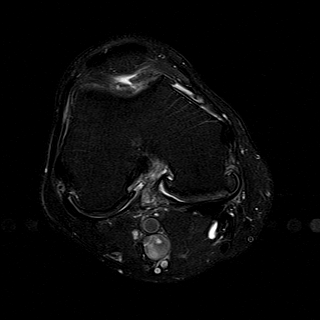
[im 24/30]
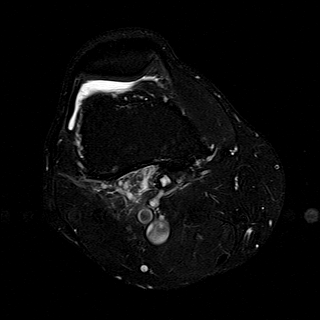
[im 30/30]
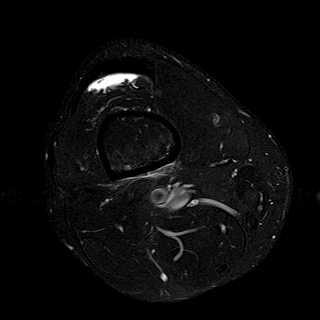

[Series 4: T1 · coronal · 4.0mm · 0.62mm/px · 6 of 31 slices shown]
[im 1/31]
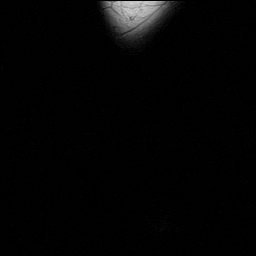
[im 7/31]
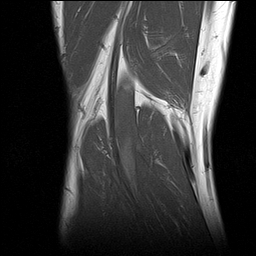
[im 13/31]
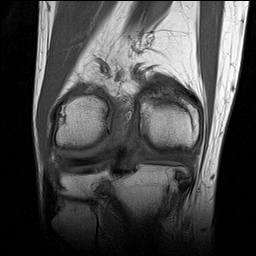
[im 19/31]
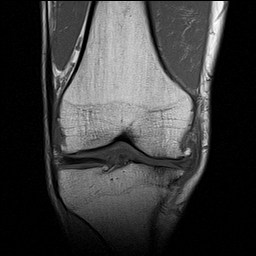
[im 25/31]
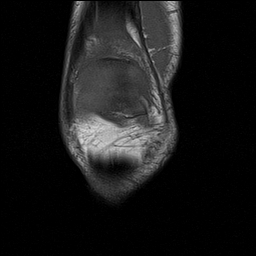
[im 31/31]
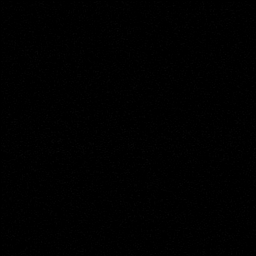

[Series 6: PD fat-sat · coronal · 4.0mm · 0.62mm/px · 6 of 31 slices shown (1 of 3)]
[im 1/31]
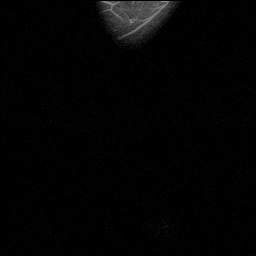
[im 7/31]
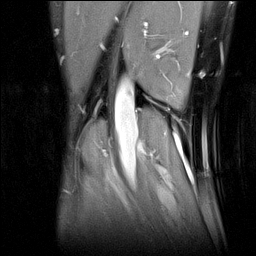
[im 13/31]
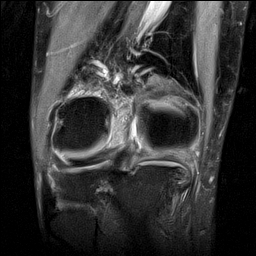
[im 19/31]
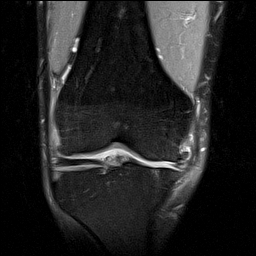
[im 25/31]
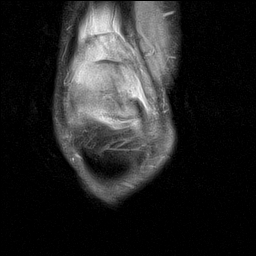
[im 31/31]
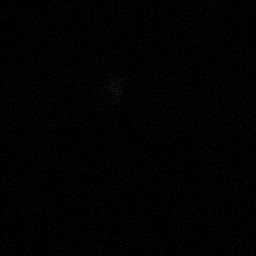

[Series 7: T2 fat-sat · coronal · 4.0mm · 0.62mm/px · 6 of 31 slices shown (2 of 3)]
[im 1/31]
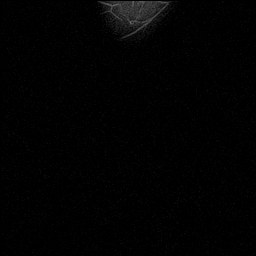
[im 7/31]
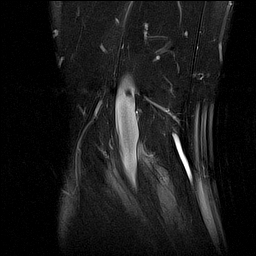
[im 13/31]
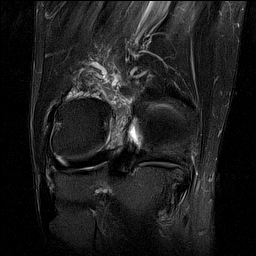
[im 19/31]
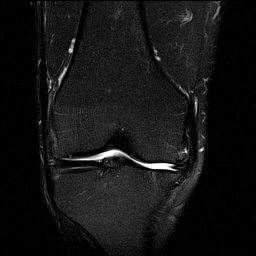
[im 25/31]
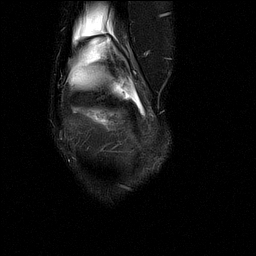
[im 31/31]
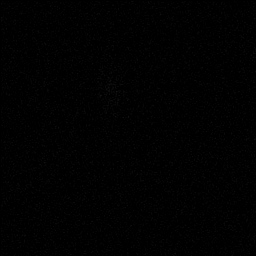

[Series 8: PD fat-sat · sagittal · 3.0mm · 0.62mm/px · 6 of 32 slices shown (2 of 3)]
[im 1/32]
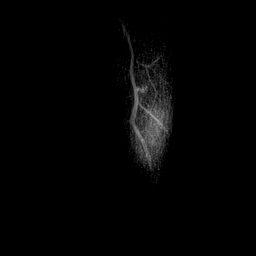
[im 7/32]
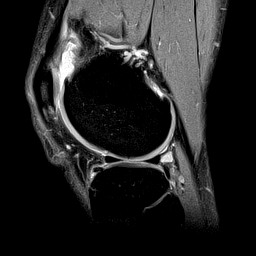
[im 13/32]
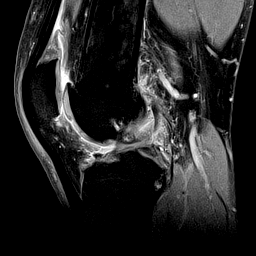
[im 19/32]
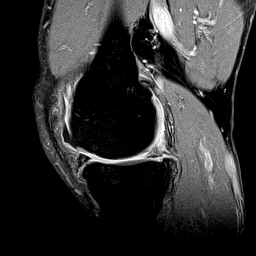
[im 25/32]
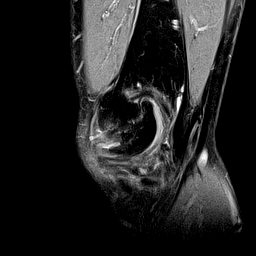
[im 32/32]
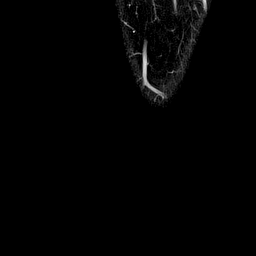

[Series 9: T2 fat-sat · sagittal · 3.0mm · 0.62mm/px · 6 of 32 slices shown (3 of 3)]
[im 1/32]
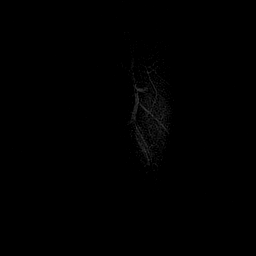
[im 7/32]
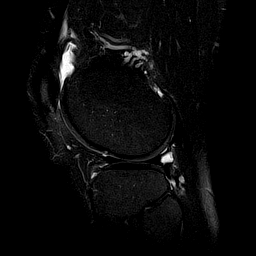
[im 13/32]
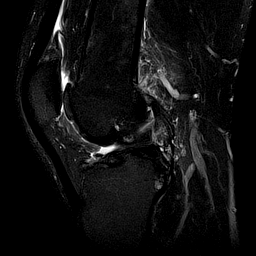
[im 19/32]
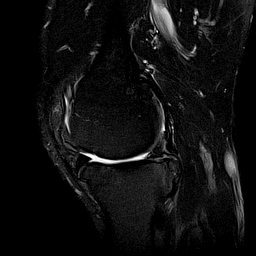
[im 25/32]
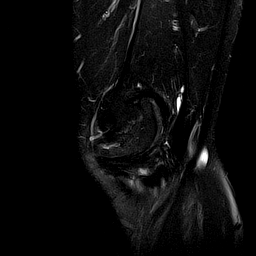
[im 32/32]
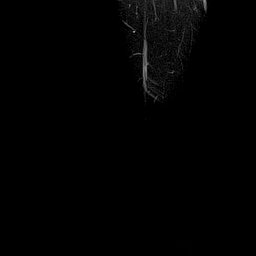

[Series 10: PD fat-sat · coronal · 2.0mm · 0.62mm/px · 4 of 19 slices shown (3 of 3)]
[im 1/19]
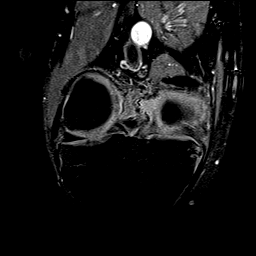
[im 7/19]
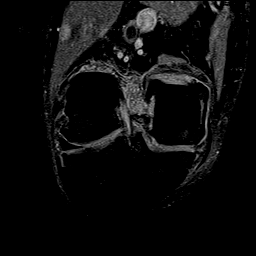
[im 13/19]
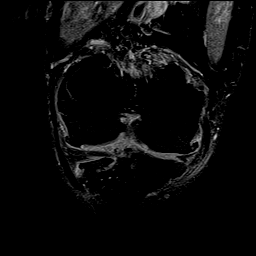
[im 19/19]
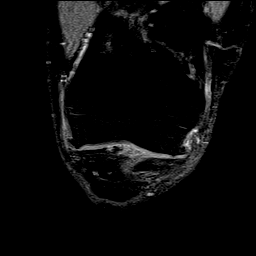

[40 of 40 positions shown; findings below may reference images not displayed]

FINDINGS: MENISCI

Medial meniscus: Degeneration of the medial meniscus. Complex tear
of the posterior horn of the medial meniscus with a radial component
and peripheral meniscal extrusion.

Lateral meniscus:  Intact.

LIGAMENTS

Cruciates:  Intact ACL and PCL.

Collaterals: Medial collateral ligament is intact. Lateral
collateral ligament complex is intact.

CARTILAGE

Patellofemoral: Mild partial-thickness cartilage loss of lateral
patellofemoral compartment. Partial-thickness cartilage loss of
medial trochlea.

Medial: Extensive full-thickness cartilage loss of the medial
femoral condyle and medial tibial plateau.

Lateral: Mild cartilage irregularity of the lateral tibial plateau
without a chondral defect.

Joint: Large joint effusion. Normal Hoffa's fat. No plical
thickening.

Popliteal Fossa:  Small Baker's cyst.  Intact popliteus tendon.

Extensor Mechanism: Intact quadriceps tendon. Intact patellar
tendon. Intact medial patellar retinaculum. Intact lateral patellar
retinaculum. Intact MPFL.

Bones:  No acute osseous abnormality.  No aggressive osseous lesion.

Other: No fluid collection or hematoma.  Muscles are normal.
IMPRESSION: 1. Degeneration of the medial meniscus. Complex tear of the
posterior horn of the medial meniscus with a radial component and
peripheral meniscal extrusion.
2. Extensive full-thickness cartilage loss of the medial femoral
condyle and medial tibial plateau.
3. Mild partial-thickness cartilage loss of lateral patellofemoral
compartment. Partial-thickness cartilage loss of medial trochlea.

## 2022-05-25 NOTE — Progress Notes (Signed)
 Pt c/o extreme stuffiness, congestion for about 6-8 weeks, mainly coming from allergies.

## 2023-11-23 ENCOUNTER — Encounter (INDEPENDENT_AMBULATORY_CARE_PROVIDER_SITE_OTHER): Payer: Self-pay | Admitting: *Deleted
